# Patient Record
Sex: Female | Born: 1975
Health system: Southern US, Community
[De-identification: ages and names within clinical notes are randomized; demographics above are authoritative.]

## PROBLEM LIST (undated history)

## (undated) HISTORY — PX: FINGER SURGERY: SHX640

---

## 1999-12-17 ENCOUNTER — Other Ambulatory Visit: Admission: RE | Admit: 1999-12-17 | Discharge: 1999-12-17 | Payer: Self-pay | Admitting: Obstetrics

## 2013-05-24 ENCOUNTER — Encounter: Payer: Self-pay | Admitting: Family Medicine

## 2015-11-04 ENCOUNTER — Other Ambulatory Visit: Payer: Self-pay | Admitting: Infectious Disease

## 2015-11-04 DIAGNOSIS — Z1231 Encounter for screening mammogram for malignant neoplasm of breast: Secondary | ICD-10-CM

## 2015-11-06 ENCOUNTER — Ambulatory Visit
Admission: RE | Admit: 2015-11-06 | Discharge: 2015-11-06 | Disposition: A | Discharge status: Home / Self Care | Payer: No Typology Code available for payment source | Source: Ambulatory Visit | Attending: Infectious Disease | Admitting: Infectious Disease

## 2015-11-06 DIAGNOSIS — Z1231 Encounter for screening mammogram for malignant neoplasm of breast: Secondary | ICD-10-CM

## 2015-11-12 ENCOUNTER — Other Ambulatory Visit: Payer: Self-pay | Admitting: Infectious Disease

## 2015-11-12 DIAGNOSIS — R928 Other abnormal and inconclusive findings on diagnostic imaging of breast: Secondary | ICD-10-CM

## 2015-11-18 ENCOUNTER — Ambulatory Visit
Admission: RE | Admit: 2015-11-18 | Discharge: 2015-11-18 | Disposition: A | Discharge status: Home / Self Care | Payer: No Typology Code available for payment source | Source: Ambulatory Visit | Attending: Infectious Disease | Admitting: Infectious Disease

## 2015-11-18 DIAGNOSIS — R928 Other abnormal and inconclusive findings on diagnostic imaging of breast: Secondary | ICD-10-CM

## 2016-01-12 ENCOUNTER — Other Ambulatory Visit: Payer: Self-pay

## 2016-10-07 ENCOUNTER — Other Ambulatory Visit: Payer: Self-pay | Admitting: Infectious Disease

## 2016-10-07 DIAGNOSIS — Z1231 Encounter for screening mammogram for malignant neoplasm of breast: Secondary | ICD-10-CM

## 2016-11-23 ENCOUNTER — Ambulatory Visit
Admission: RE | Admit: 2016-11-23 | Discharge: 2016-11-23 | Disposition: A | Discharge status: Home / Self Care | Payer: No Typology Code available for payment source | Source: Ambulatory Visit | Attending: Infectious Disease | Admitting: Infectious Disease

## 2016-11-23 DIAGNOSIS — Z1231 Encounter for screening mammogram for malignant neoplasm of breast: Secondary | ICD-10-CM

## 2017-12-22 ENCOUNTER — Other Ambulatory Visit: Payer: Self-pay | Admitting: Obstetrics and Gynecology

## 2017-12-22 DIAGNOSIS — Z1231 Encounter for screening mammogram for malignant neoplasm of breast: Secondary | ICD-10-CM

## 2017-12-25 ENCOUNTER — Ambulatory Visit
Admission: RE | Admit: 2017-12-25 | Discharge: 2017-12-25 | Disposition: A | Discharge status: Home / Self Care | Payer: No Typology Code available for payment source | Source: Ambulatory Visit | Attending: Obstetrics and Gynecology | Admitting: Obstetrics and Gynecology

## 2017-12-25 DIAGNOSIS — Z1231 Encounter for screening mammogram for malignant neoplasm of breast: Secondary | ICD-10-CM

## 2017-12-27 ENCOUNTER — Telehealth (HOSPITAL_COMMUNITY): Payer: Self-pay

## 2017-12-27 ENCOUNTER — Other Ambulatory Visit: Payer: Self-pay | Admitting: Obstetrics and Gynecology

## 2017-12-27 DIAGNOSIS — R928 Other abnormal and inconclusive findings on diagnostic imaging of breast: Secondary | ICD-10-CM

## 2017-12-27 NOTE — Telephone Encounter (Signed)
Called patient and left her a message to call us back to get scheduled for B[facility]CP.

## 2018-01-03 ENCOUNTER — Other Ambulatory Visit (HOSPITAL_COMMUNITY): Payer: Self-pay | Admitting: *Deleted

## 2018-01-03 DIAGNOSIS — Z1231 Encounter for screening mammogram for malignant neoplasm of breast: Secondary | ICD-10-CM

## 2018-01-18 ENCOUNTER — Encounter (HOSPITAL_COMMUNITY): Payer: Self-pay | Admitting: *Deleted

## 2018-01-18 ENCOUNTER — Encounter (HOSPITAL_COMMUNITY): Payer: Self-pay

## 2018-01-18 ENCOUNTER — Ambulatory Visit (HOSPITAL_COMMUNITY)
Admission: RE | Admit: 2018-01-18 | Discharge: 2018-01-18 | Disposition: A | Discharge status: Home / Self Care | Payer: No Typology Code available for payment source | Source: Ambulatory Visit | Attending: Obstetrics and Gynecology | Admitting: Obstetrics and Gynecology

## 2018-01-18 ENCOUNTER — Ambulatory Visit: Payer: No Typology Code available for payment source

## 2018-01-18 ENCOUNTER — Ambulatory Visit
Admission: RE | Admit: 2018-01-18 | Discharge: 2018-01-18 | Disposition: A | Discharge status: Home / Self Care | Payer: Self-pay | Source: Ambulatory Visit | Attending: Obstetrics and Gynecology | Admitting: Obstetrics and Gynecology

## 2018-01-18 DIAGNOSIS — R928 Other abnormal and inconclusive findings on diagnostic imaging of breast: Secondary | ICD-10-CM

## 2018-01-18 DIAGNOSIS — Z1239 Encounter for other screening for malignant neoplasm of breast: Secondary | ICD-10-CM

## 2018-01-18 NOTE — Progress Notes (Signed)
When checking patient in patient became upset because was not receiving pap smear today. Advised patient just [facility] in July 2019 and was negative. B[facility]CP would not be able to do pap today under the guidelines. Patient stated Jenna Wyatt advised would be receiving today. Patient also stated if had insurance would receive pap smear. Advised patient that was a false statement whether someone has insurance or does not have insurance B[facility]CP would not perform a pap smear today. Patient did not want BP taken because it would be to high. Patient also demanded to speak with Jenna Wyatt.

## 2018-01-18 NOTE — Progress Notes (Signed)
[facility] due to recommending additional imaging of the right breast. Screening mammogram completed 12/25/2017.  Pap Smear: Pap smear not completed today. [facility] and normal[facility] Per patient has a history of an abnormal Pap smear around 4 years ago that a colposcopy was completed for follow-up. [facility] Pap smear result and will scan into Epic.  Physical exam: Breasts Breasts symmetrical. No skin abnormalities bilateral breasts. No nipple retraction bilateral breasts. No nipple discharge bilateral breasts. No lymphadenopathy. No lumps palpated bilateral breasts. No complaints of pain or tenderness on exam. [facility] for a right breast diagnostic mammogram and possible ultrasound per recommendation. Appointment scheduled for Thursday, January 18, 2018 at 0950.        Pelvic/Bimanual No Pap smear completed today since last Pap smear was 08/30/2017. Pap smear not indicated per BCCCP guidelines.   Smoking History: Patient has never smoked.  Patient Navigation: Patient education provided. Access to services provided for patient through BCCCP program.   Breast and Cervical Cancer Risk Assessment: Patient has a family history of her maternal grandmother having breast cancer. Patient has no known genetic mutations or history of radiation treatment to the chest before age 42. Per patient has a history of cervical dysplasia. Patient has no history of being immunocompromised or DES exposure in-utero.  Risk Assessment    Risk Scores      01/18/2018   Last edited by: Lynnell DikeHolland, Sabrina H, LPN   5-year risk: 0.7 %   Lifetime risk: 9.5 %

## 2018-01-18 NOTE — Patient Instructions (Addendum)
Explained breast self awareness with Jenna Wyatt. Patient did not need a Pap smear today due to last Pap smear was 08/30/2017. Let patient know that her next Pap smear will be due in one year since patient stated she had an abnormal Pap smear around 4 years ago and has not had three normal Pap smears since. [facility] for a right breast diagnostic mammogram and possible ultrasound per recommendation. Appointment scheduled for Thursday, January 18, 2018 at 0950.     Patient aware of appointment and will be there. Jenna Wyatt verbalized understanding.  Odysseus Cada, Kathaleen Maserhristine Poll, RN 8:03 AM

## 2018-01-29 ENCOUNTER — Encounter (HOSPITAL_COMMUNITY): Payer: Self-pay | Admitting: *Deleted

## 2018-02-16 ENCOUNTER — Telehealth (HOSPITAL_COMMUNITY): Payer: Self-pay

## 2018-02-16 NOTE — Telephone Encounter (Signed)
Returned patients voicemail about the free cervical screening. Left phone number for her to call back to register for screening.

## 2018-03-23 ENCOUNTER — Encounter (HOSPITAL_COMMUNITY): Payer: Self-pay | Admitting: *Deleted

## 2018-03-23 NOTE — Progress Notes (Signed)
Patient called about appointment for Free Cervical Screening. Assisted [place] with call. Advised patient was not due for pap smear since last pap smear was normal in July 2019. Previous pap smears sent from [facility] are all normal. Patient was being very irate on phone call and stated if had insurance pap would be done. Also, stated that when had pap done in July 2019 was told lost her cervix during pap and she feels pap was not accurate. Advised patient pap does show endocervical zone is present and pap was adequate. Patient then proceeded to say was getting another call and put me on hold. I ended the call.

## 2018-03-26 ENCOUNTER — Ambulatory Visit: Payer: No Typology Code available for payment source

## 2018-04-03 ENCOUNTER — Ambulatory Visit (HOSPITAL_COMMUNITY): Payer: No Typology Code available for payment source

## 2018-04-03 ENCOUNTER — Other Ambulatory Visit: Payer: No Typology Code available for payment source

## 2019-02-06 ENCOUNTER — Other Ambulatory Visit (HOSPITAL_COMMUNITY): Payer: Self-pay | Admitting: *Deleted

## 2019-02-06 DIAGNOSIS — Z1231 Encounter for screening mammogram for malignant neoplasm of breast: Secondary | ICD-10-CM

## 2019-03-19 ENCOUNTER — Ambulatory Visit
Admission: RE | Admit: 2019-03-19 | Discharge: 2019-03-19 | Disposition: A | Discharge status: Home / Self Care | Payer: No Typology Code available for payment source | Source: Ambulatory Visit | Attending: Obstetrics and Gynecology | Admitting: Obstetrics and Gynecology

## 2019-03-19 ENCOUNTER — Other Ambulatory Visit: Payer: Self-pay

## 2019-03-19 ENCOUNTER — Encounter: Payer: Self-pay | Admitting: Advanced Practice Midwife

## 2019-03-19 ENCOUNTER — Ambulatory Visit: Payer: Self-pay | Admitting: Advanced Practice Midwife

## 2019-03-19 VITALS — BP 110/78 | Temp 97.1°F | Wt 172.0 lb

## 2019-03-19 DIAGNOSIS — Z0000 Encounter for general adult medical examination without abnormal findings: Secondary | ICD-10-CM

## 2019-03-19 DIAGNOSIS — Z1231 Encounter for screening mammogram for malignant neoplasm of breast: Secondary | ICD-10-CM

## 2019-03-19 NOTE — Progress Notes (Signed)
Ms. Jenna Wyatt is a 44 y.o. [id number] female who presents to [facility] clinic today with no complaints.    Pap Smear: Pap smear completed today. [facility] was 07/2017 [facility] and was normal. Per patient has history of an abnormal Pap smear. Last Pap smear result is not available in Epic.   Physical exam: Breasts Breasts symmetrical. No skin abnormalities bilateral breasts. No nipple retraction bilateral breasts. No nipple discharge bilateral breasts. No lymphadenopathy. No lumps palpated bilateral breasts.       Pelvic/Bimanual Pap is not indicated today    Smoking History: Patient has never smoked    Patient Navigation: Patient education provided. Access to services provided for patient through BCCCP program.  Colorectal Cancer Screening: Per patient has never had colonoscopy completed No complaints today.    Breast and Cervical Cancer Risk Assessment: Patient has family history of breast cancer, known genetic mutations, or radiation treatment to the chest before age 46.Reports that maternal grandmother had breast cancer diagnosed in her 67s. Patient has history of cervical dysplasia, immunocompromised, or DES exposure in-utero.  Risk Assessment    No risk assessment data for the current encounter   Risk Scores      01/18/2018   Last edited by: Lynnell Dike, LPN   5-year risk: 0.7 %   Lifetime risk: 9.5 %          A: BCCCP exam with pap smear Complaint of NONE  P: Referred patient to the [facility] for a screening mammogram. Appointment scheduled 03/19/2019.  Thressa Sheller DNP, CNM  03/19/19  10:26 AM

## 2020-09-03 ENCOUNTER — Telehealth: Payer: Self-pay

## 2020-09-03 NOTE — Telephone Encounter (Signed)
Patient called and requested to schedule mammogram/BCCCP appointment. I began to schedule appointment,and when I explained to patient that the screening mammogram would be completed on the mobile unit that would be located in our parking lot @ [address] Patient declined. I told patient I would discuss with the Breast Center and call her back. I discussed with Fonnie Mu, RN Recruitment consultant), who stated that the rule per [facility] is that all screenings are to go to the mobile unit. I called patient back, and informed her of this, she declined the mobile unit, requested to go to a brick and mortar building. Patient then asked for Christine's supervisor, and stated today was the date of her grandmother's death, and was not wanting to have long conversations with anyone. Patient didn't want to hear the same thing about the mobile unit from [place] again.  I informed patient that Wynona Canes is the person she needs to speak to discuss her concerns, and  would call her back this afternoon or tomorrow. Patient stated she would be ready for this conversation.

## 2020-09-04 ENCOUNTER — Encounter: Payer: Self-pay | Admitting: *Deleted

## 2020-09-04 ENCOUNTER — Telehealth: Payer: Self-pay | Admitting: *Deleted

## 2020-09-04 NOTE — Telephone Encounter (Signed)
Attempted to return patients call. No one answered phone. Left voicemail for patient to call me back.

## 2022-02-13 IMAGING — MG DIGITAL SCREENING BILAT W/ TOMO W/ CAD
8 series · 9 of 24 positions shown · non-contrast
Comparison: Previous exam(s).

CLINICAL DATA: Screening.

EXAM:
DIGITAL SCREENING BILATERAL MAMMOGRAM WITH TOMO AND CAD

[L CC synth-2D]
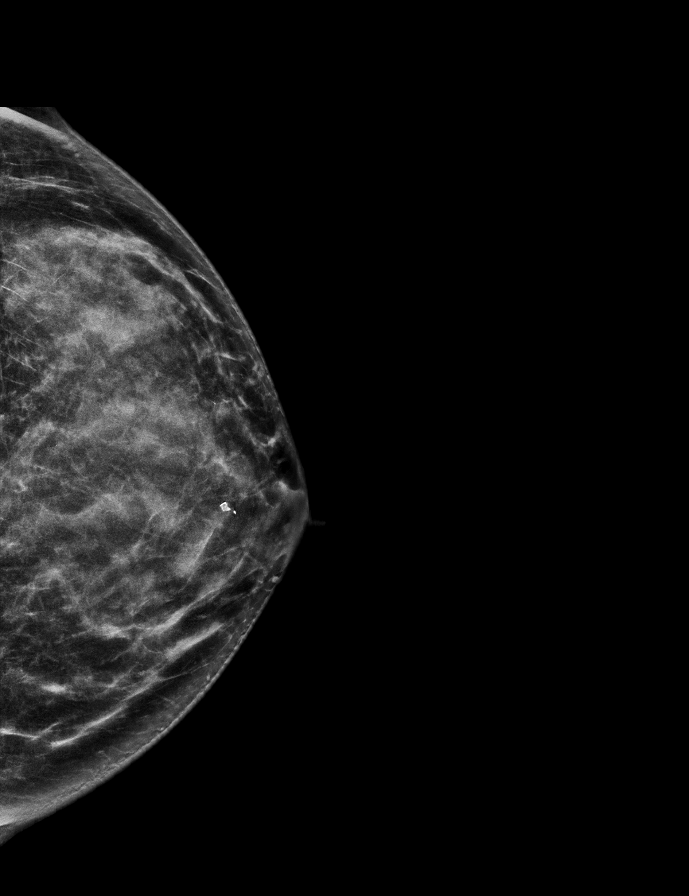

[R MLO synth-2D]
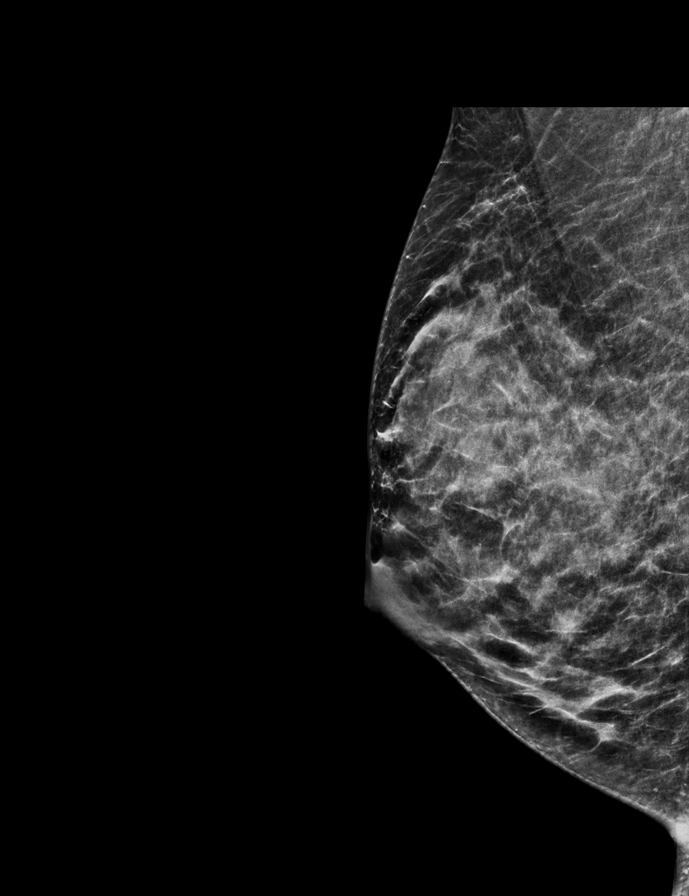

[R CC synth-2D]
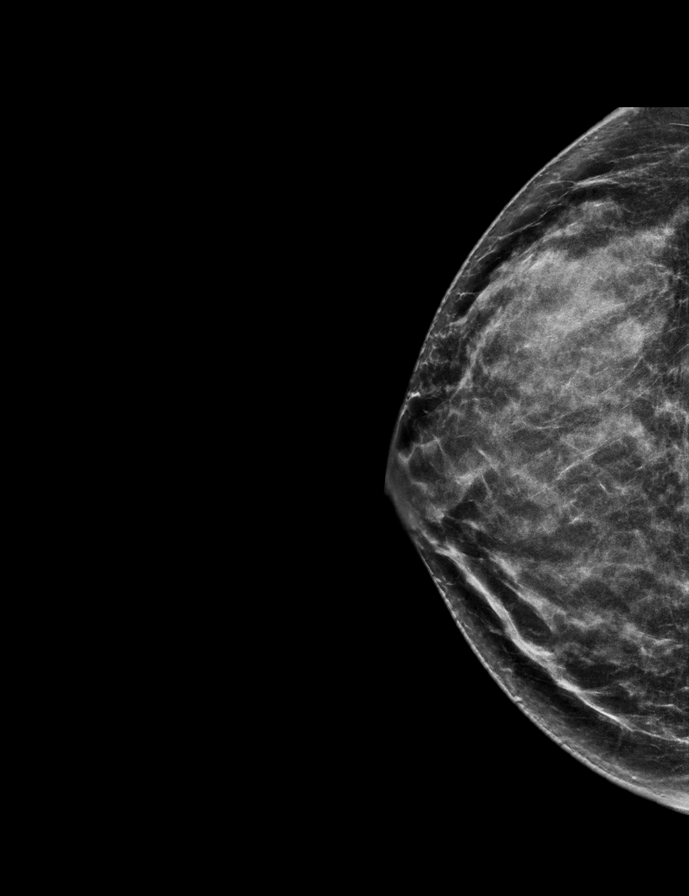

[L MLO synth-2D]
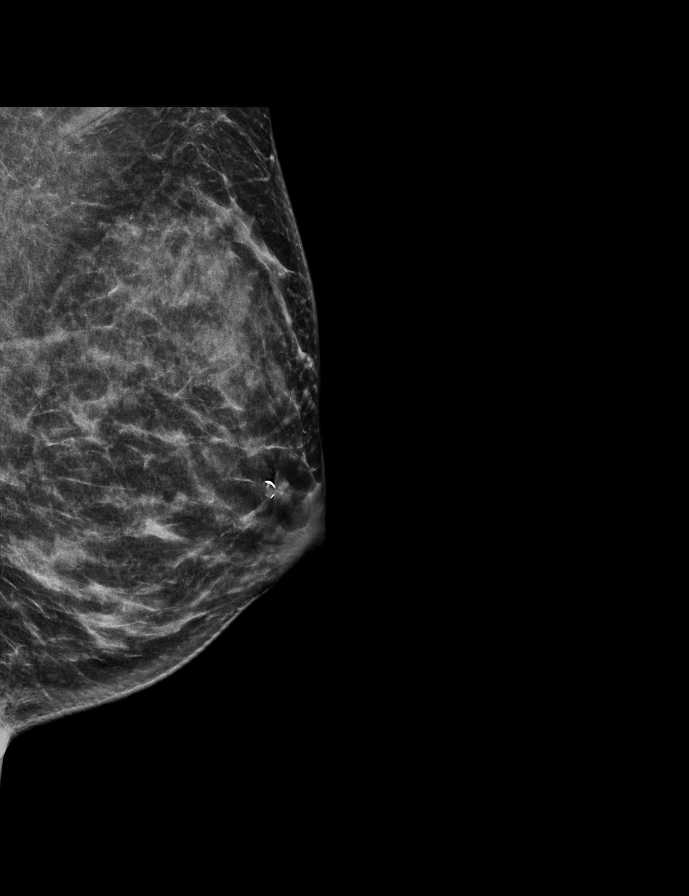

[R MLO tomo · 2 of 54 frames shown]
[frame 18/54]
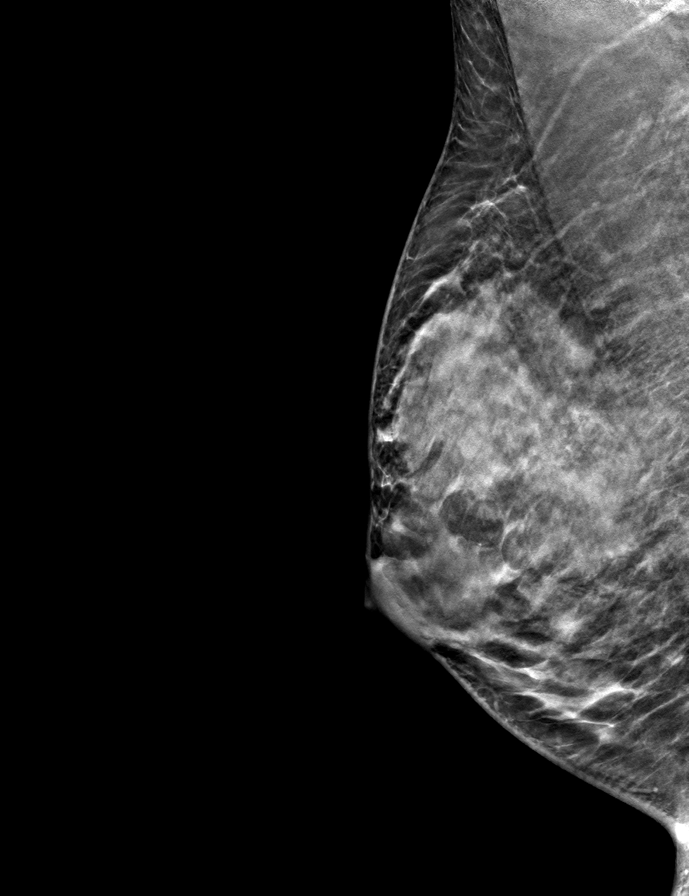
[frame 27/54]
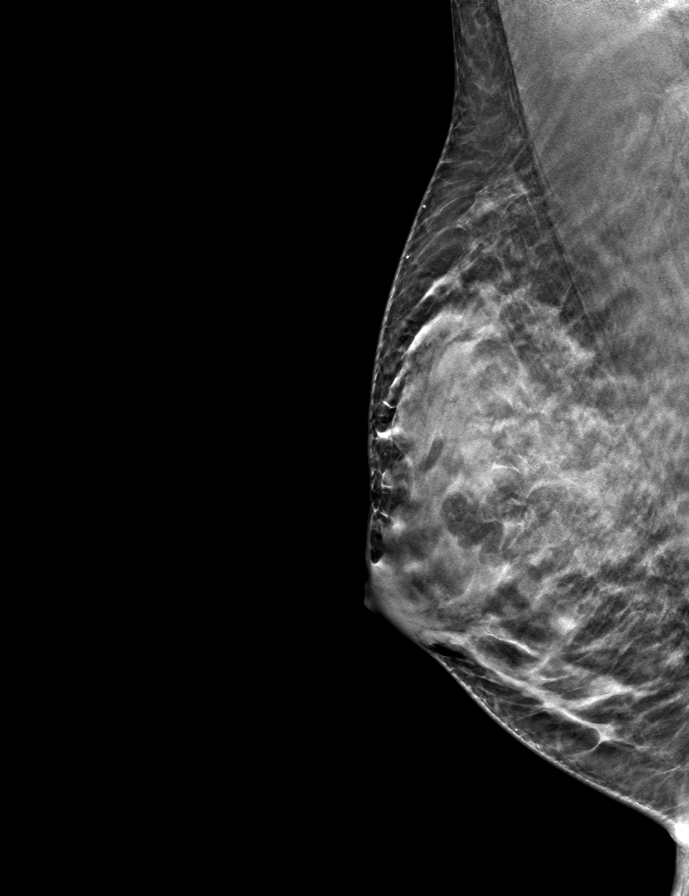

[L CC tomo · tomo slice 29/58.0]
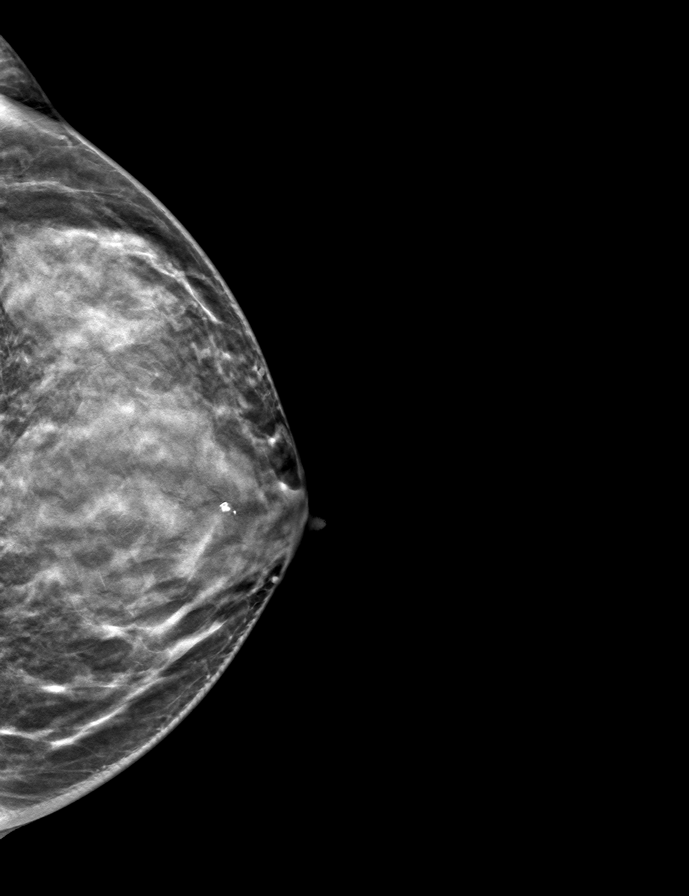

[L MLO tomo · tomo slice 30/59.0]
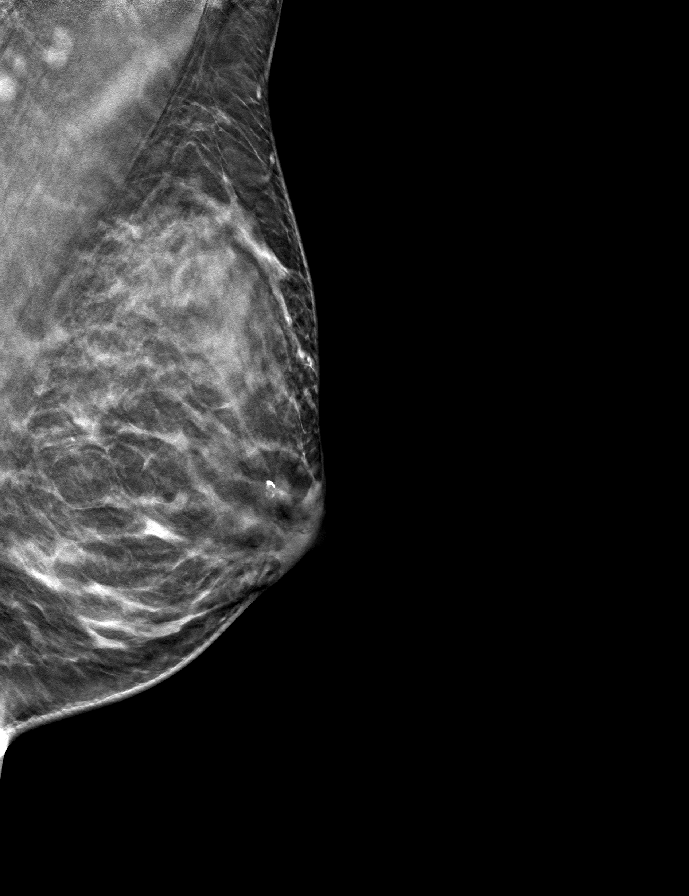

[R CC tomo · tomo slice 29/57.0]
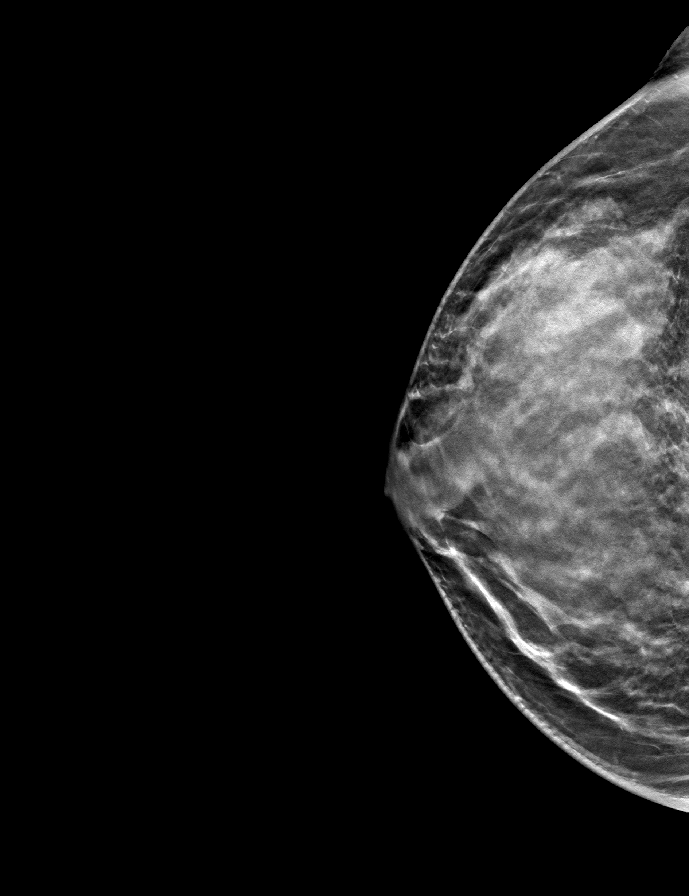

[9 of 24 positions shown; findings below may reference images not displayed]

ACR Breast Density Category c: The breast tissue is heterogeneously
dense, which may obscure small masses.
FINDINGS: There are no findings suspicious for malignancy. Images were
processed with CAD.
IMPRESSION: No mammographic evidence of malignancy. A result letter of this
screening mammogram will be mailed directly to the patient.

RECOMMENDATION:
Screening mammogram in one year. (Code:FT-U-LHB)

BI-RADS CATEGORY  1: Negative.

## 2022-05-25 ENCOUNTER — Ambulatory Visit
Admission: EM | Admit: 2022-05-25 | Discharge: 2022-05-25 | Disposition: A | Discharge status: Home / Self Care | Payer: Medicaid Other | Attending: Internal Medicine | Admitting: Internal Medicine

## 2022-05-25 DIAGNOSIS — N898 Other specified noninflammatory disorders of vagina: Secondary | ICD-10-CM | POA: Diagnosis present

## 2022-05-25 NOTE — ED Triage Notes (Signed)
Patient here today with c/o vaginal discharge X 3 months. She has been abstinent since 2017. She has tried to use Monistat with no relief. She does not have any itching or abdominal pain. She has gained a little weight over the years. She does take a birth control that she has been on for years to help regulate her cycles. She does not know the name of it.

## 2022-05-25 NOTE — ED Provider Notes (Signed)
[facility]    CSN: [id number] Arrival date & time: 05/25/22  1718      History   Chief Complaint Chief Complaint  Patient presents with   Vaginal Discharge    HPI Jenna Wyatt is a 47 y.o. female.   Patient presents with vaginal discharge that has been present for about 3 months.  Patient reports that she is not sure if it is constant or if it is intermittent.  She is also not sure the exact color but thinks it is a milky white color.  Denies any vaginal irritation, dysuria, urinary frequency, pelvic pain, abdominal pain, back pain, fever.  Denies any exposure to STD and reports that she has not had sexual intercourse since 2017.  Has tried Monistat with no improvement in symptoms.  Last menstrual cycle was early April per patient report.  She has not seen any other healthcare provider since symptoms started. Has been wearing tampons due to vaginal discharge.    Vaginal Discharge   History reviewed. No pertinent past medical history.  There are no problems to display for this patient.   Past Surgical History:  Procedure Laterality Date   FINGER SURGERY      OB History     Gravida  0   Para  0   Term  0   Preterm  0   AB  0   Living  0      SAB  0   IAB  0   Ectopic  0   Multiple  0   Live Births  0            Home Medications    Prior to Admission medications   Not on File    Family History Family History  Problem Relation Age of Onset   Breast cancer Maternal Grandmother    Diabetes Maternal Grandfather    Cancer Maternal Grandfather    Diabetes Paternal Grandmother     Social History Social History   Tobacco Use   Smoking status: Never   Smokeless tobacco: Never  Vaping Use   Vaping Use: Never used  Substance Use Topics   Alcohol use: Not Currently   Drug use: Not Currently     Allergies   Penicillins   Review of Systems Review of Systems Per HPI  Physical Exam Triage Vital Signs ED Triage  Vitals  Enc Vitals Group     BP 05/25/22 1816 139/88     Pulse Rate 05/25/22 1816 91     Resp 05/25/22 1816 16     Temp 05/25/22 1816 98.3 F (36.8 C)     Temp Source 05/25/22 1816 Oral     SpO2 05/25/22 1816 97 %     Weight 05/25/22 1816 200 lb (90.7 kg)     Height 05/25/22 1816  (1.6 m)     Head Circumference --      Peak Flow --      Pain Score 05/25/22 1815 0     Pain Loc --      Pain Edu? --      Excl. in GC? --    No data found.  Updated Vital Signs BP 139/88 (BP Location: Left Arm)   Pulse 91   Temp 98.3 F (36.8 C) (Oral)   Resp 16   Ht  (1.6 m)   Wt 200 lb (90.7 kg)   LMP 05/09/2022 (Exact Date)   SpO2 97%   BMI 35.43 kg/m  Visual Acuity Right Eye Distance:   Left Eye Distance:   Bilateral Distance:    Right Eye Near:   Left Eye Near:    Bilateral Near:     Physical Exam Constitutional:      General: She is not in acute distress.    Appearance: Normal appearance. She is not toxic-appearing or diaphoretic.  HENT:     Head: Normocephalic and atraumatic.  Eyes:     Extraocular Movements: Extraocular movements intact.     Conjunctiva/sclera: Conjunctivae normal.  Pulmonary:     Effort: Pulmonary effort is normal.  Genitourinary:    Comments: Deferred with shared decision making.  Self swab performed. Neurological:     General: No focal deficit present.     Mental Status: She is alert and oriented to person, place, and time. Mental status is at baseline.  Psychiatric:        Mood and Affect: Mood normal.        Behavior: Behavior normal.        Thought Content: Thought content normal.        Judgment: Judgment normal.      UC Treatments / Results  Labs (all labs ordered are listed, but only abnormal results are displayed) Labs Reviewed  CERVICOVAGINAL ANCILLARY ONLY    EKG   Radiology No results found.  Procedures Procedures (including critical care time)  Medications Ordered in UC Medications - No data to  display  Initial Impression / Assessment and Plan / UC Course  I have reviewed the triage vital signs and the nursing notes.  Pertinent labs & imaging results that were available during my care of the patient were reviewed by me and considered in my medical decision making (see chart for details).     Cervicovaginal swab pending to test for BV and vaginal yeast.  Awaiting result for treatment.  Patient is not having any pelvic pain or any other associated symptoms concerning for PID so do not think that pelvic exam is necessary.  I do think the patient needs to see a gynecologist given duration of symptoms so ambulatory referral to gynecology was placed.  Advised patient if they do not call her within 48 to 72 hours then she needs to call them herself to schedule the appointment.  Suggested basic blood work and CBG today to ensure that vaginal yeast infection is not related to any hyperglycemia but patient declined this wishing for this to be completed at family medicine doctor.  PCP appointment was made for patient on 4/26.  Also advised patient to avoid tampon use if able and to use a panty liner instead.  Encouraged strict ER precautions.  Patient verbalized understanding and was agreeable with plan. Final Clinical Impressions(s) / UC Diagnoses   Final diagnoses:  Vaginal discharge     Discharge Instructions      Your vaginal swab is pending to test for bacterial vaginosis and vaginal yeast.  We will call if it is abnormal and treat as appropriate.  I recommend that you see gynecology so referral has been placed.  If they do not call you in 48 hours, please call them at scheduled appointment.  Follow-up with primary care doctor as well.    ED Prescriptions   None    PDMP not reviewed this encounter.   Gustavus Bryant, [place] 05/25/22 1905

## 2022-05-25 NOTE — Discharge Instructions (Signed)
Your vaginal swab is pending to test for bacterial vaginosis and vaginal yeast.  We will call if it is abnormal and treat as appropriate.  I recommend that you see gynecology so referral has been placed.  If they do not call you in 48 hours, please call them at scheduled appointment.  Follow-up with primary care doctor as well.

## 2022-05-26 LAB — CERVICOVAGINAL ANCILLARY ONLY
Bacterial Vaginitis (gardnerella): NEGATIVE
Candida Glabrata: NEGATIVE
Candida Vaginitis: NEGATIVE
Comment: NEGATIVE
Comment: NEGATIVE
Comment: NEGATIVE

## 2022-05-27 ENCOUNTER — Encounter: Payer: Self-pay | Admitting: Family Medicine

## 2022-05-27 ENCOUNTER — Ambulatory Visit: Payer: Medicaid Other | Admitting: Family Medicine

## 2022-05-27 VITALS — BP 132/86 | Ht 63.0 in | Wt 210.2 lb

## 2022-05-27 DIAGNOSIS — N898 Other specified noninflammatory disorders of vagina: Secondary | ICD-10-CM | POA: Diagnosis not present

## 2022-05-27 DIAGNOSIS — E669 Obesity, unspecified: Secondary | ICD-10-CM | POA: Diagnosis not present

## 2022-05-27 DIAGNOSIS — Z113 Encounter for screening for infections with a predominantly sexual mode of transmission: Secondary | ICD-10-CM

## 2022-05-27 DIAGNOSIS — Z1211 Encounter for screening for malignant neoplasm of colon: Secondary | ICD-10-CM | POA: Diagnosis not present

## 2022-05-27 DIAGNOSIS — Z7689 Persons encountering health services in other specified circumstances: Secondary | ICD-10-CM

## 2022-05-27 NOTE — Progress Notes (Unsigned)
   New Patient Office Visit  Subjective    Patient ID: Jenna Wyatt, female    DOB: 1975/06/26  Age: 47 y.o. MRN: [id number]  CC: No chief complaint on file.   HPI Jenna Wyatt presents to establish care ***.   No previous pcp.    No previous pmh.    Vaginal discharge  Overdue for pap - doesn't remember.    Vaginal discharge - a few months.  Slihgtly more than normal.  Clear in color.  Some odor, not strong.  No itching or burning.    PMH: ***  PSH: R hand surgery   FH: DM- MGF, breast ca - MGM, prostate Ca - MGF.    Tobacco use: none.   Alcohol use: no Drug use: no Marital status: single.  No children.   Employment: temp jobs.   Sexual hx: not sexually active recently.    Screenings:  Colon Cancer: *** Lung Cancer: *** Breast Cancer: *** Diabetes: *** HLD: ***   No outpatient encounter medications on file as of 05/27/2022.   No facility-administered encounter medications on file as of 05/27/2022.    No past medical history on file.  Past Surgical History:  Procedure Laterality Date   FINGER SURGERY      Family History  Problem Relation Age of Onset   Breast cancer Maternal Grandmother    Diabetes Maternal Grandfather    Cancer Maternal Grandfather    Diabetes Paternal Grandmother     Social History   Socioeconomic History   Marital status: Single    Spouse name: Not on file   Number of children: Not on file   Years of education: Not on file   Highest education level: Some college, no degree  Occupational History   Not on file  Tobacco Use   Smoking status: Never   Smokeless tobacco: Never  Vaping Use   Vaping Use: Never used  Substance and Sexual Activity   Alcohol use: Not Currently   Drug use: Not Currently   Sexual activity: Not Currently    Birth control/protection: Pill  Other Topics Concern   Not on file  Social History Narrative   Not on file   Social Determinants of Health   Financial Resource Strain: Not on  file  Food Insecurity: Not on file  Transportation Needs: No Transportation Needs (03/19/2019)   PRAPARE - Transportation    Lack of Transportation (Medical): No    Lack of Transportation (Non-Medical): No  Physical Activity: Not on file  Stress: Not on file  Social Connections: Not on file  Intimate Partner Violence: Not on file    ROS      Objective    LMP 05/09/2022 (Exact Date)   Physical Exam  {Labs (Optional):23779}    Assessment & Plan:   Problem List Items Addressed This Visit   None   No follow-ups on file.   Sandre Kitty, MD

## 2022-05-27 NOTE — Patient Instructions (Signed)
It was nice to meet you today,    You will need to come back next week in the morning so we can get lab tests for routine blood work.   I will send in a referral for a colonoscopy.  Someone should call you to schedule.  If you haven't heard from anyone in a week please call us.    Schedule your pap for some time in the next month.    Have a great day,   Dr. Constance Goltz

## 2022-05-30 ENCOUNTER — Encounter: Payer: Self-pay | Admitting: Family Medicine

## 2022-05-30 ENCOUNTER — Telehealth (HOSPITAL_COMMUNITY): Payer: Self-pay | Admitting: Emergency Medicine

## 2022-05-30 DIAGNOSIS — N898 Other specified noninflammatory disorders of vagina: Secondary | ICD-10-CM | POA: Insufficient documentation

## 2022-05-30 DIAGNOSIS — E669 Obesity, unspecified: Secondary | ICD-10-CM | POA: Insufficient documentation

## 2022-05-30 DIAGNOSIS — Z7689 Persons encountering health services in other specified circumstances: Secondary | ICD-10-CM | POA: Insufficient documentation

## 2022-05-30 NOTE — Telephone Encounter (Signed)
Received voicemail from patient requesting a call back.  Called patient and she wanted recommendations on products to try for her vaginal infection.  Recommended not using any products at this time until she is able to follow up with a Gynecologist, as recommended in the provider note.  Patient kept trying to offer options that this RN would agree with OTC, but I continued to explain that the testing we performed was negative, and if patient has had symptoms for several months, that waiting for an appt with Gyn should be safe.  Reviewed return precautions.  Patient expressed some frustration with there not being an explanation or recommendation for fixing her symptoms at this time, but verbalized understanding

## 2022-05-30 NOTE — Assessment & Plan Note (Signed)
Send in referral to gastroenterology for colonoscopy - Schedule visit for next month for Pap smear - Follow-up routine screening for A1c, lipid, hep C

## 2023-01-12 ENCOUNTER — Ambulatory Visit (HOSPITAL_BASED_OUTPATIENT_CLINIC_OR_DEPARTMENT_OTHER): Payer: Medicaid Other | Admitting: Family Medicine

## 2023-01-12 ENCOUNTER — Encounter (HOSPITAL_BASED_OUTPATIENT_CLINIC_OR_DEPARTMENT_OTHER): Payer: Self-pay | Admitting: Family Medicine

## 2023-01-12 VITALS — BP 135/86 | HR 69 | Ht 63.0 in | Wt 186.9 lb

## 2023-01-12 DIAGNOSIS — Z23 Encounter for immunization: Secondary | ICD-10-CM

## 2023-01-12 DIAGNOSIS — Z1211 Encounter for screening for malignant neoplasm of colon: Secondary | ICD-10-CM | POA: Diagnosis not present

## 2023-01-12 DIAGNOSIS — Z114 Encounter for screening for human immunodeficiency virus [HIV]: Secondary | ICD-10-CM

## 2023-01-12 DIAGNOSIS — Z3009 Encounter for other general counseling and advice on contraception: Secondary | ICD-10-CM

## 2023-01-12 DIAGNOSIS — Z124 Encounter for screening for malignant neoplasm of cervix: Secondary | ICD-10-CM

## 2023-01-12 DIAGNOSIS — Z1159 Encounter for screening for other viral diseases: Secondary | ICD-10-CM | POA: Diagnosis not present

## 2023-01-12 DIAGNOSIS — F411 Generalized anxiety disorder: Secondary | ICD-10-CM

## 2023-01-12 DIAGNOSIS — Z7689 Persons encountering health services in other specified circumstances: Secondary | ICD-10-CM

## 2023-01-12 LAB — POCT URINE PREGNANCY: Preg Test, Ur: NEGATIVE

## 2023-01-12 MED ORDER — NORGESTIMATE-ETH ESTRADIOL 0.18/0.215/0.25 MG-25 MCG PO TABS: 1.0000 | ORAL_TABLET | Freq: Every day | ORAL | 3 refills | Status: DC

## 2023-01-12 MED ORDER — NORGESTIMATE-ETH ESTRADIOL 0.18/0.215/0.25 MG-25 MCG PO TABS: 1.0000 | ORAL_TABLET | Freq: Every day | ORAL | 3 refills | Status: AC

## 2023-01-12 MED ORDER — FLUOXETINE HCL 10 MG PO CAPS: 10.0000 mg | ORAL_CAPSULE | Freq: Every day | ORAL | 3 refills | Status: AC

## 2023-01-12 MED ORDER — FLUOXETINE HCL 10 MG PO CAPS: 10.0000 mg | ORAL_CAPSULE | Freq: Every day | ORAL | 3 refills | Status: DC

## 2023-01-12 NOTE — Progress Notes (Signed)
New Patient Office Visit  Subjective:   Jenna Wyatt [birth year] 01/12/2023  Chief Complaint  Patient presents with   New Patient (Initial Visit)    Patient is here today to get established with the practice.    HPI: Jenna Wyatt presents today to establish care at [facility]. Introduced to Publishing rights manager role and practice setting.  All questions answered.   Last PCP: None Last annual physical:  Concerns: See below    Birth Control:  Pt has been on Tri Lo marzia for several years for oral contraception. She has been out of oral contraception for the past month.  Patient reports she is still having regular periods and denies symptoms of menopause. She is not a smoker.  Patient's last menstrual period was 01/03/2023 (exact date).  ANXIETY: Sami A Michetti presents for the medical management of anxiety. 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  Current medication regimen: None Counseling: She does have good support with family member Well controlled: Not currently Denies SI/HI.      01/12/2023    2:46 PM 05/27/2022   11:31 AM  GAD 7 : Generalized Anxiety Score  Nervous, Anxious, on Edge 1 0  Control/stop worrying 1 0  Worry too much - different things 1 0  Trouble relaxing 1 0  Restless 1 0  Easily annoyed or irritable 1 0  Afraid - awful might happen 1 0  Total GAD 7 Score 7 0  Anxiety Difficulty Very difficult Not difficult at all      01/12/2023    2:45 PM 05/27/2022   11:31 AM  PHQ9 SCORE ONLY  PHQ-9 Total Score 0 0      The following portions of the patient's history were reviewed and updated as appropriate: past medical history, past surgical history, family history, social history, allergies, medications, and problem list.   Patient Active Problem List    Diagnosis Date Noted   GAD (generalized anxiety disorder) 01/12/2023   Obesity (BMI 30-39.9) 05/30/2022   Vaginal discharge 05/30/2022   Encounter to establish care 05/30/2022   History reviewed. No pertinent past medical history. Past Surgical History:  Procedure Laterality Date   FINGER SURGERY     Family History  Problem Relation Age of Onset   Breast cancer Maternal Grandmother    Diabetes Maternal Grandfather    Cancer Maternal Grandfather        Colon   Diabetes Paternal Grandmother    Social History   Socioeconomic History   Marital status: Single    Spouse name: Not on file   Number of children: Not on file   Years of education: Not on file   Highest education level: Some college, no degree  Occupational History   Not on file  Tobacco Use   Smoking status: Never   Smokeless tobacco: Never  Vaping Use   Vaping status: Never Used  Substance and Sexual Activity   Alcohol use: Not Currently   Drug use: Not Currently   Sexual activity: Not Currently    Birth control/protection: Pill  Other Topics Concern   Not on file  Social History Narrative   ** Merged History Encounter **       Social Drivers of Corporate investment banker Strain: Not on file  Food Insecurity: Not on file  Transportation Needs: No Transportation  Needs (03/19/2019)   PRAPARE - Administrator, Civil Service (Medical): No    Lack of Transportation (Non-Medical): No  Physical Activity: Not on file  Stress: Not on file  Social Connections: Not on file  Intimate Partner Violence: Not on file   Outpatient Medications Prior to Visit  Medication Sig Dispense Refill   Norgestimate-Eth Estradiol (TRI-LO-MARZIA) 0.18/0.215/0.25 MG-25 MCG TABS Take by mouth.     No facility-administered medications prior to visit.   Allergies  Allergen Reactions   Penicillins     ROS: A complete ROS was performed with pertinent positives/negatives noted in the HPI. The remainder of the ROS  are negative.   Objective:   Today's Vitals   01/12/23 1435  BP: 135/86  Pulse: 69  SpO2: 100%  Weight: 186 lb 14.4 oz (84.8 kg)  Height: 5\' 3"  (1.6 m)    GENERAL: Well-appearing, in NAD. Well nourished.  SKIN: Pink, warm and dry. No rash, lesion, ulceration, or ecchymoses.  Head: Normocephalic. NECK: Trachea midline. Full ROM w/o pain or tenderness.  RESPIRATORY: Chest wall symmetrical. Respirations even and non-labored.  EXTREMITIES: Without clubbing, cyanosis, or edema.  NEUROLOGIC: No motor or sensory deficits. Steady, even gait. C2-C12 intact.  PSYCH/MENTAL STATUS: Alert, oriented x 3. Cooperative, appropriate mood and affect.    Health Maintenance Due  Topic Date Due   HIV Screening  Never done   Hepatitis C Screening  Never done   Colonoscopy  Never done   Cervical Cancer Screening (HPV/Pap Cotest)  08/30/2020    Results for orders placed or performed in visit on 01/12/23  POCT urine pregnancy  Result Value Ref Range   Preg Test, Ur Negative Negative       Assessment & Plan:  1. Colon cancer screening - Ambulatory referral to Gastroenterology  2. Immunization due - Flu vaccine trivalent PF, 6mos and older(Flulaval,Afluria,Fluarix,Fluzone)  3. Encounter to establish care with new doctor (Primary) Discussed role of PCP with patient and need for physical exam.  Will obtain fasting lab work with next visit to establish baseline of health.  Will also complete Pap smear with upcoming physical. - CBC with Differential/Platelet; Future - Comprehensive metabolic panel; Future - Lipid panel; Future - TSH; Future  4. Counseling for birth control, oral contraceptives Urine pregnancy negative.  We discussed benefits and risk of hormonal contraception with patient and she verbalized understanding.  Will restart OCP as directed. - POCT urine pregnancy  5. Cervical cancer screening Will obtain Pap with upcoming annual exam. - [facility]ology - Pap [LAB4]; Future  6.  GAD (generalized anxiety disorder) Discussed possible triggers and ways to manage anxiety.  Counseling resources recommended and provided.  Patient to start fluoxetine 10 mg daily.  Safety plan reviewed with patient and she verbalized understanding.  Will follow-up in approximately 4 to 6 weeks or sooner if needed.  7. Encounter for hepatitis C screening test for low risk patient Will obtain with upcoming lab work with patient permission for prevention screening - Hepatitis C antibody; Future  8. Encounter for screening for HIV Will obtain with upcoming lab work with patient permission for prevention screening - HIV Antibody (routine testing w rflx); Future  Patient to reach out to office if new, worrisome, or unresolved symptoms arise or if no improvement in patient's condition. Patient verbalized understanding and is agreeable to treatment plan. All questions answered to patient's satisfaction.    Return in about 6 weeks (around 02/23/2023) for ANNUAL PHYSICAL , Pap, Mood follow up (  Fasting labs prior) .    Hilbert Bible, [place]

## 2023-01-12 NOTE — Patient Instructions (Signed)
Counseling and Mental Health Resources   [facility]  - For Women and Girls only - Cost based upon sliding scale of income - Financial Aid available  [phone] [address], [address] [place], [place] [address] [facility]  - Mental Health, Substance Abuse Treatment - IV Ketamine, Hydration and Weight Loss Programs - Center for Treatment for Resistant Depression and Suicidal Ideation  [address] [place], [place] [address]  [phone] [email or web address]   [facility]  - Individual and Family Counseling - Assessments and Therapy for Learning Disabilities, ADHD, Autism Spectrum Disorder, Processing Deficits  [phone] [address], [address] [place], [place] [address]  [facility]  [address] [place], [place] [address]  [phone]  [facility]  - Individual, Family, Play and Group Therapy - In person and telehealth sessions available  Phone: [phone] Email: [email or web address]  [place] Location:   [address] [place], [place] [address]   [place] Location:   [address] [place], [place] [address] [facility]  [address] ([address]) [place], [place] [address]  [phone]  [facility], [facility]  Adult, Adolescent and [facility]  [address], [place] [address]  Text:  [phone]   Call:  [phone] Email: [email or web address] Su Ley MA Clinical Psychology  [address] [place] [address]  [phone]  [facility]  - Individual, Group Therapy - Day Programs, Wellness Coaching - Staff Programming, Workshops  [address], [place], [place] [address]  [phone]  [facility] - [place]  Grief and Trauma Counseling    [facility]  - Indivudual Counseling, Enneagram Therapy [address] [place], [place] [address]  [phone] [facility]  - Children, Adolescent, Adult and Family Therapy  [place] Location [phone]  [address] [place], [place] [address]   [place] Location [phone]  [address] [place] [address]    [facility]  Counseling offered by Art therapist Students  - Majority of Patients qualify for financial assistance   [address] [place], [place] [address]  [phone]   [facility]  -Services at "less than a basic fee" sponsored by [facility]  [address] [place], [place] [address]  [phone]

## 2023-01-23 ENCOUNTER — Ambulatory Visit (HOSPITAL_BASED_OUTPATIENT_CLINIC_OR_DEPARTMENT_OTHER): Payer: Medicaid Other | Admitting: Family Medicine

## 2023-01-23 ENCOUNTER — Ambulatory Visit (HOSPITAL_BASED_OUTPATIENT_CLINIC_OR_DEPARTMENT_OTHER)
Admission: RE | Admit: 2023-01-23 | Discharge: 2023-01-23 | Disposition: A | Discharge status: Home / Self Care | Payer: Medicaid Other | Source: Ambulatory Visit | Attending: Family Medicine | Admitting: Family Medicine

## 2023-01-23 ENCOUNTER — Other Ambulatory Visit (HOSPITAL_COMMUNITY)
Admission: RE | Admit: 2023-01-23 | Discharge: 2023-01-23 | Disposition: A | Discharge status: Home / Self Care | Payer: Medicaid Other | Source: Ambulatory Visit | Attending: Family Medicine | Admitting: Family Medicine

## 2023-01-23 ENCOUNTER — Encounter (HOSPITAL_BASED_OUTPATIENT_CLINIC_OR_DEPARTMENT_OTHER): Payer: Self-pay | Admitting: Family Medicine

## 2023-01-23 ENCOUNTER — Encounter (HOSPITAL_BASED_OUTPATIENT_CLINIC_OR_DEPARTMENT_OTHER): Payer: Self-pay | Admitting: Radiology

## 2023-01-23 VITALS — BP 129/84 | HR 71 | Ht 63.0 in | Wt 182.0 lb

## 2023-01-23 DIAGNOSIS — Z1231 Encounter for screening mammogram for malignant neoplasm of breast: Secondary | ICD-10-CM

## 2023-01-23 DIAGNOSIS — Z114 Encounter for screening for human immunodeficiency virus [HIV]: Secondary | ICD-10-CM

## 2023-01-23 DIAGNOSIS — Z1211 Encounter for screening for malignant neoplasm of colon: Secondary | ICD-10-CM

## 2023-01-23 DIAGNOSIS — Z124 Encounter for screening for malignant neoplasm of cervix: Secondary | ICD-10-CM | POA: Insufficient documentation

## 2023-01-23 DIAGNOSIS — Z0000 Encounter for general adult medical examination without abnormal findings: Secondary | ICD-10-CM | POA: Diagnosis not present

## 2023-01-23 DIAGNOSIS — E669 Obesity, unspecified: Secondary | ICD-10-CM

## 2023-01-23 DIAGNOSIS — Z1159 Encounter for screening for other viral diseases: Secondary | ICD-10-CM

## 2023-01-23 DIAGNOSIS — L819 Disorder of pigmentation, unspecified: Secondary | ICD-10-CM | POA: Insufficient documentation

## 2023-01-23 NOTE — Progress Notes (Signed)
Subjective:   Jenna Wyatt 1976/01/23  01/23/2023   CC: Chief Complaint  Patient presents with   Annual Exam    Patient is here today to have her physical with the pap smear. Denies any concerns.    HPI: Jenna Wyatt is a 47 y.o. female who presents for a routine health maintenance exam.  Labs collected at time of visit.    HEALTH SCREENINGS: - Vision Screening: not applicable - Dental Visits: up to date - Pap smear: pap done - Breast Exam:  Completed today - STD Screening: Ordered today - Mammogram (40+): Ordered today  - Colonoscopy (45+): Ordered today  - Bone Density (65+ or under 65 with predisposing conditions): Not applicable  - Lung CA screening with low-dose CT:  Not applicable Adults age 80-80 who are current cigarette smokers or quit within the last 15 years. Must have 20 pack year history.   Depression and Anxiety Screen done today and results listed below:     01/12/2023    2:45 PM 05/27/2022   11:31 AM  Depression screen PHQ 2/9  Decreased Interest 0 0  Down, Depressed, Hopeless 0 0  PHQ - 2 Score 0 0  Altered sleeping 0 0  Tired, decreased energy 0 0  Change in appetite 0 0  Feeling bad or failure about yourself  0 0  Trouble concentrating 0 0  Moving slowly or fidgety/restless 0 0  Suicidal thoughts 0 0  PHQ-9 Score 0 0  Difficult doing work/chores Not difficult at all       01/12/2023    2:46 PM 05/27/2022   11:31 AM  GAD 7 : Generalized Anxiety Score  Nervous, Anxious, on Edge 1 0  Control/stop worrying 1 0  Worry too much - different things 1 0  Trouble relaxing 1 0  Restless 1 0  Easily annoyed or irritable 1 0  Afraid - awful might happen 1 0  Total GAD 7 Score 7 0  Anxiety Difficulty Very difficult Not difficult at all    IMMUNIZATIONS: - Tdap: Tetanus vaccination status reviewed: last tetanus booster within 10 years. - HPV: Not applicable - Influenza: Up to date - Pneumovax: Not applicable - Prevnar 20: Not  applicable - Shingrix (50+): Not applicable   Past medical history, surgical history, medications, allergies, family history and social history reviewed with patient today and changes made to appropriate areas of the chart.   History reviewed. No pertinent past medical history.  Past Surgical History:  Procedure Laterality Date   FINGER SURGERY      Current Outpatient Medications on File Prior to Visit  Medication Sig   FLUoxetine (PROZAC) 10 MG capsule Take 1 capsule (10 mg total) by mouth daily.   Norgestimate-Eth Estradiol (TRI-LO-MARZIA) 0.18/0.215/0.25 MG-25 MCG TABS Take 1 tablet by mouth daily.   No current facility-administered medications on file prior to visit.    Allergies  Allergen Reactions   Penicillins      Social History   Socioeconomic History   Marital status: Single    Spouse name: Not on file   Number of children: Not on file   Years of education: Not on file   Highest education level: Some college, no degree  Occupational History   Not on file  Tobacco Use   Smoking status: Never   Smokeless tobacco: Never  Vaping Use   Vaping status: Never Used  Substance and Sexual Activity   Alcohol use: Not Currently   Drug use: Not Currently  Sexual activity: Not Currently    Birth control/protection: Pill  Other Topics Concern   Not on file  Social History Narrative   ** Merged History Encounter **       Social Drivers of Corporate investment banker Strain: Not on file  Food Insecurity: Not on file  Transportation Needs: No Transportation Needs (03/19/2019)   PRAPARE - Administrator, Civil Service (Medical): No    Lack of Transportation (Non-Medical): No  Physical Activity: Not on file  Stress: Not on file  Social Connections: Not on file  Intimate Partner Violence: Not on file   Social History   Tobacco Use  Smoking Status Never  Smokeless Tobacco Never   Social History   Substance and Sexual Activity  Alcohol Use Not  Currently    Family History  Problem Relation Age of Onset   Breast cancer Maternal Grandmother    Diabetes Maternal Grandfather    Cancer Maternal Grandfather        Colon   Diabetes Paternal Grandmother      ROS: Denies fever, fatigue, unexplained weight loss/gain, chest pain, SHOB, and palpitations. Denies neurological deficits, gastrointestinal or genitourinary complaints, and skin changes.   Objective:   Today's Vitals   01/23/23 1459  BP: 129/84  Pulse: 71  SpO2: 100%  Weight: 182 lb (82.6 kg)  Height: 5\' 3"  (1.6 m)    GENERAL APPEARANCE: Well-appearing, in NAD. Well nourished.  SKIN: Pink, warm and dry. Turgor normal. No rash, ulceration, or ecchymoses. Hair evenly distributed. Small papular pigmented lesion approx. 2 mm present to posterior right calf without erythema or drainage. Mild asymmetry present on dermatoscope exam.  HEENT: HEAD: Normocephalic.  EYES: PERRLA. EOMI. Lids intact w/o defect. Sclera white, Conjunctiva pink w/o exudate.  EARS: External ear w/o redness, swelling, masses or lesions. EAC clear. TM's intact, translucent w/o bulging, appropriate landmarks visualized. Appropriate acuity to conversational tones.  NOSE: Septum midline w/o deformity. Nares patent, mucosa pink and non-inflamed w/o drainage. No sinus tenderness.  THROAT: Uvula midline. Oropharynx clear. Tonsils non-inflamed w/o exudate. Oral mucosa pink and moist.  NECK: Supple, Trachea midline. Full ROM w/o pain or tenderness. No lymphadenopathy. Thyroid non-tender w/o enlargement or palpable masses.  BREASTS: Breasts pendulous, symmetrical, and w/o palpable masses. Nipples everted and w/o discharge. No rash or skin retraction. No axillary or supraclavicular lymphadenopathy.  RESPIRATORY: Chest wall symmetrical w/o masses. Respirations even and non-labored. Breath sounds clear to auscultation bilaterally. No wheezes, rales, rhonchi, or crackles. CARDIAC: S1, S2 present, regular rate and rhythm.  No gallops, murmurs, rubs, or clicks. PMI w/o lifts, heaves, or thrills. No carotid bruits. Capillary refill <2 seconds. Peripheral pulses 2+ bilaterally. GI: Abdomen soft w/o distention. Normoactive bowel sounds. No palpable masses or tenderness. No guarding or rebound tenderness. Liver and spleen w/o tenderness or enlargement. No CVA tenderness.  GU: External genitalia without erythema, lesions, or masses. No lymphadenopathy. Vaginal mucosa pink and moist without exudate, lesions, or ulcerations. Cervix pink without discharge. Cervical os closed. Uterus and adnexae palpable, not enlarged, and w/o tenderness. No palpable masses.  MSK: Muscle tone and strength appropriate for age, w/o atrophy or abnormal movement.  EXTREMITIES: Active ROM intact, w/o tenderness, crepitus, or contracture. No obvious joint deformities or effusions. No clubbing, edema, or cyanosis.  NEUROLOGIC: CN's II-XII intact. Motor strength symmetrical with no obvious weakness. No sensory deficits. DTR's 2+ symmetric bilaterally. Steady, even gait.  PSYCH/MENTAL STATUS: Alert, oriented x 3. Cooperative, appropriate mood and affect.  Chaperoned by Cristy Hilts, CMA     Assessment & Plan:  1. Annual physical exam (Primary) Pt doing well overall. Will complete labs today and will be notified of results. Will repeat AE in 1 year. Discussed preventative screenings, vaccines, and healthy lifestyle.   - CBC with Differential/Platelet - Comprehensive metabolic panel - Lipid panel - Hemoglobin A1c  2. Cervical cancer screening Pap completed. Will notify of results when available.  - Cytology - PAP  3. Breast cancer screening by mammogram Pt to be called to scheduled at [facility] per patient request.  - MM 3D SCREENING MAMMOGRAM BILATERAL BREAST; Future  4. Screening for colon cancer - Ambulatory referral to Gastroenterology  5. Encounter for screening for HIV - HIV Antibody (routine testing w rflx)  6.  Encounter for hepatitis C screening test for low risk patient - Hepatitis C antibody  7. Pigmented skin lesion suspicious for malignant neoplasm Concern for suspicious lesion as patient states this has been flaking, bleeding at times. Will return for shave biopsy and pathology testing within 1-2 weeks.    Orders Placed This Encounter  Procedures   MM 3D SCREENING MAMMOGRAM BILATERAL BREAST    Standing Status:   Future    Number of Occurrences:   1    Expiration Date:   01/23/2024    Reason for Exam (SYMPTOM  OR DIAGNOSIS REQUIRED):   Screening for Breast Cancer    Is the patient pregnant?:   No    Preferred imaging location?:   [facility]   CBC with Differential/Platelet   Comprehensive metabolic panel   Lipid panel   Hemoglobin A1c   HIV Antibody (routine testing w rflx)   Hepatitis C antibody   Ambulatory referral to Gastroenterology    Referral Priority:   Routine    Referral Type:   Consultation    Referral Reason:   Specialty Services Required    Number of Visits Requested:   1    PATIENT COUNSELING:  - Encouraged a healthy well-balanced diet. Patient may adjust caloric intake to maintain or achieve ideal body weight. May reduce intake of dietary saturated fat and total fat and have adequate dietary potassium and calcium preferably from fresh fruits, vegetables, and low-fat dairy products.   - Advised to avoid cigarette smoking. - Discussed with the patient that most people either abstain from alcohol or drink within safe limits (<=14/week and <=4 drinks/occasion for males, <=7/weeks and <= 3 drinks/occasion for females) and that the risk for alcohol disorders and other health effects rises proportionally with the number of drinks per week and how often a drinker exceeds daily limits. - Discussed cessation/primary prevention of drug use and availability of treatment for abuse.  - Discussed sexually transmitted diseases, avoidance of unintended pregnancy and  contraceptive alternatives.  - Stressed the importance of regular exercise - Injury prevention: Discussed safety belts, safety helmets, smoke detector, smoking near bedding or upholstery.  - Dental health: Discussed importance of regular tooth brushing, flossing, and dental visits.   NEXT PREVENTATIVE PHYSICAL DUE IN 1 YEAR.  Return in about 2 weeks (around 02/06/2023) for Skin Procedure (40 min please) .  Patient to reach out to office if new, worrisome, or unresolved symptoms arise or if no improvement in patient's condition. Patient verbalized understanding and is agreeable to treatment plan. All questions answered to patient's satisfaction.    Hilbert Bible, [place]

## 2023-01-24 LAB — COMPREHENSIVE METABOLIC PANEL
ALT: 43 [IU]/L — ABNORMAL HIGH (ref 0–32)
AST: 18 [IU]/L (ref 0–40)
Albumin: 4 g/dL (ref 3.9–4.9)
Alkaline Phosphatase: 86 [IU]/L (ref 44–121)
BUN/Creatinine Ratio: 22 (ref 9–23)
BUN: 16 mg/dL (ref 6–24)
Bilirubin Total: 0.4 mg/dL (ref 0.0–1.2)
CO2: 21 mmol/L (ref 20–29)
Calcium: 9.3 mg/dL (ref 8.7–10.2)
Chloride: 104 mmol/L (ref 96–106)
Creatinine, Ser: 0.74 mg/dL (ref 0.57–1.00)
Globulin, Total: 2.6 g/dL (ref 1.5–4.5)
Glucose: 73 mg/dL (ref 70–99)
Potassium: 3.7 mmol/L (ref 3.5–5.2)
Sodium: 140 mmol/L (ref 134–144)
Total Protein: 6.6 g/dL (ref 6.0–8.5)
eGFR: 100 mL/min/{1.73_m2} (ref >59)

## 2023-01-24 LAB — LIPID PANEL
Chol/HDL Ratio: 3.7 {ratio} (ref 0.0–4.4)
Cholesterol, Total: 205 mg/dL — ABNORMAL HIGH (ref 100–199)
HDL: 56 mg/dL (ref >39)
LDL Chol Calc (NIH): 133 mg/dL — ABNORMAL HIGH (ref 0–99)
Triglycerides: 92 mg/dL (ref 0–149)
VLDL Cholesterol Cal: 16 mg/dL (ref 5–40)

## 2023-01-24 LAB — HIV ANTIBODY (ROUTINE TESTING W REFLEX): HIV Screen 4th Generation wRfx: NONREACTIVE

## 2023-01-24 LAB — CBC WITH DIFFERENTIAL/PLATELET
Basophils Absolute: 0 10*3/uL (ref 0.0–0.2)
Basos: 0 %
EOS (ABSOLUTE): 0.1 10*3/uL (ref 0.0–0.4)
Eos: 1 %
Hematocrit: 40.8 % (ref 34.0–46.6)
Hemoglobin: 13.5 g/dL (ref 11.1–15.9)
Immature Grans (Abs): 0 10*3/uL (ref 0.0–0.1)
Immature Granulocytes: 0 %
Lymphocytes Absolute: 2.1 10*3/uL (ref 0.7–3.1)
Lymphs: 28 %
MCH: 27.9 pg (ref 26.6–33.0)
MCHC: 33.1 g/dL (ref 31.5–35.7)
MCV: 84 fL (ref 79–97)
Monocytes Absolute: 0.8 10*3/uL (ref 0.1–0.9)
Monocytes: 10 %
Neutrophils Absolute: 4.7 10*3/uL (ref 1.4–7.0)
Neutrophils: 61 %
Platelets: 306 10*3/uL (ref 150–450)
RBC: 4.84 x10E6/uL (ref 3.77–5.28)
RDW: 12.7 % (ref 11.7–15.4)
WBC: 7.7 10*3/uL (ref 3.4–10.8)

## 2023-01-24 LAB — HEPATITIS C ANTIBODY: Hep C Virus Ab: NONREACTIVE

## 2023-01-24 LAB — HEMOGLOBIN A1C
Est. average glucose Bld gHb Est-mCnc: 111 mg/dL
Hgb A1c MFr Bld: 5.5 % (ref 4.8–5.6)

## 2023-01-26 LAB — CYTOLOGY - PAP
Chlamydia: NEGATIVE
Comment: NEGATIVE
Comment: NEGATIVE
Comment: NORMAL
Diagnosis: NEGATIVE
High risk HPV: NEGATIVE
Neisseria Gonorrhea: NEGATIVE

## 2023-01-26 NOTE — Progress Notes (Signed)
Hi Jenna Wyatt,  We received your lab results.  Your Pap smear was normal.  It was negative for any STD as well.  We will repeat this in 3 to 5 years.  Your hep C and HIV testing was normal.  Your A1c is normal and is not in the prediabetic or diabetic range.  Your blood counts were normal.  Your electrolytes, kidney function looks great.  One of your liver enzymes was slightly elevated may be due to fatty food or recent alcohol intake or a heavy meal.  Your cholesterol was also slightly elevated.  It is not at the level that we would indicate need for a cholesterol medication therapy, but I would recommend dietary changes such as lean meats, veggies, omega-3 such as nuts, beans, olive oils, fish.  These would help to lower this number in addition to regular exercise.  I would recommend that we repeat these labs in approximately 6 months.  If you have further questions please let me know

## 2023-01-31 NOTE — Progress Notes (Signed)
Mardene,   Your mammogram results show no evidence of breast cancer. We will plan to repeat this in 1 year for routine screening.  If you should have concerns or changes in your breasts within the next year, please let us know.   Jerre Simon, FNP-C
